# Patient Record
Sex: Male | Born: 1970 | Race: White | Hispanic: No | Marital: Married | Smoking: Never smoker
Health system: Southern US, Community
[De-identification: ages and names within clinical notes are randomized; demographics above are authoritative.]

---

## 2008-04-05 ENCOUNTER — Emergency Department (HOSPITAL_COMMUNITY): Admission: EM | Admit: 2008-04-05 | Discharge: 2008-04-05 | Payer: Self-pay | Admitting: Emergency Medicine

## 2014-02-04 ENCOUNTER — Encounter (HOSPITAL_COMMUNITY): Payer: Self-pay | Admitting: Emergency Medicine

## 2014-02-04 ENCOUNTER — Emergency Department (HOSPITAL_COMMUNITY): Payer: BC Managed Care – PPO

## 2014-02-04 ENCOUNTER — Emergency Department (HOSPITAL_COMMUNITY)
Admission: EM | Admit: 2014-02-04 | Discharge: 2014-02-04 | Disposition: A | Discharge status: Home / Self Care | Payer: BC Managed Care – PPO | Attending: Emergency Medicine | Admitting: Emergency Medicine

## 2014-02-04 DIAGNOSIS — S0010XA Contusion of unspecified eyelid and periocular area, initial encounter: Secondary | ICD-10-CM | POA: Insufficient documentation

## 2014-02-04 DIAGNOSIS — Y9364 Activity, baseball: Secondary | ICD-10-CM | POA: Insufficient documentation

## 2014-02-04 DIAGNOSIS — IMO0002 Reserved for concepts with insufficient information to code with codable children: Secondary | ICD-10-CM | POA: Insufficient documentation

## 2014-02-04 DIAGNOSIS — Y9229 Other specified public building as the place of occurrence of the external cause: Secondary | ICD-10-CM | POA: Insufficient documentation

## 2014-02-04 DIAGNOSIS — W219XXA Striking against or struck by unspecified sports equipment, initial encounter: Secondary | ICD-10-CM | POA: Insufficient documentation

## 2014-02-04 DIAGNOSIS — S01111A Laceration without foreign body of right eyelid and periocular area, initial encounter: Secondary | ICD-10-CM

## 2014-02-04 DIAGNOSIS — S0591XA Unspecified injury of right eye and orbit, initial encounter: Secondary | ICD-10-CM

## 2014-02-04 DIAGNOSIS — S058X9A Other injuries of unspecified eye and orbit, initial encounter: Secondary | ICD-10-CM | POA: Insufficient documentation

## 2014-02-04 DIAGNOSIS — S0081XA Abrasion of other part of head, initial encounter: Secondary | ICD-10-CM

## 2014-02-04 DIAGNOSIS — S0511XA Contusion of eyeball and orbital tissues, right eye, initial encounter: Secondary | ICD-10-CM

## 2014-02-04 MED ORDER — HYDROCODONE-ACETAMINOPHEN 5-325 MG PO TABS: 1.0000 | ORAL_TABLET | ORAL | Status: AC | PRN

## 2014-02-04 MED ORDER — TETRACAINE HCL 0.5 % OP SOLN
1.0000 [drp] | Freq: Once | OPHTHALMIC | Status: AC
Start: 2014-02-04 — End: 2014-02-04
  Administered 2014-02-04: 1 [drp] via OPHTHALMIC
  Filled 2014-02-04: qty 2

## 2014-02-04 MED ORDER — IBUPROFEN 800 MG PO TABS
800.0000 mg | ORAL_TABLET | Freq: Once | ORAL | Status: AC
  Administered 2014-02-04: 800 mg via ORAL
  Filled 2014-02-04: qty 1

## 2014-02-04 MED ORDER — FLUORESCEIN SODIUM 1 MG OP STRP
1.0000 | ORAL_STRIP | Freq: Once | OPHTHALMIC | Status: AC
  Administered 2014-02-04: 1 via OPHTHALMIC
  Filled 2014-02-04: qty 1

## 2014-02-04 NOTE — ED Provider Notes (Signed)
CSN: [id number]     Arrival date & time 02/04/14  1939 History  This chart was scribed for non-physician provider Antony Madura, PA-C, working with Hurman Horn, MD by Phillis Haggis, ED Scribe. This patient was seen in room WTR5/WTR5 and patient care was started at 8:55 PM.    Chief Complaint  Patient presents with  . Facial Laceration    Right eye denies vision changes   Patient is a 43 y.o. male presenting with skin laceration. The history is provided by the patient. No language interpreter was used.  Laceration Location:  Face Facial laceration location:  Face and R eye Bleeding: controlled   Laceration mechanism:  Blunt object Ineffective treatments:  None tried  HPI Comments: William Sosa is a 43 y.o. male who presents to the Emergency Department complaining of a facial laceration onset 2.5 hours ago. He states that he went to softball practice after work at [employer] where he was wearing sunglasses. He had the ball thrown at him without his knowledge to which he was hit in the face. He states that the ball hit his sunglasses and did not break the lens, but they did get knocked off his face. He states that his visual range is limited with photophobia. He denies LOC or blurred vision.   History reviewed. No pertinent past medical history. History reviewed. No pertinent past surgical history. No family history on file. History  Substance Use Topics  . Smoking status: Never Smoker   . Smokeless tobacco: Current User    Types: Snuff, Chew  . Alcohol Use: Yes     Comment: occ    Review of Systems  Eyes: Positive for photophobia, pain and visual disturbance.  Skin: Positive for wound.  Neurological: Negative for syncope.  All other systems reviewed and are negative.   Allergies  Bee venom and Poison ivy extract  Home Medications   Prior to Admission medications   Medication Sig Start Date End Date Taking? Authorizing Provider  HYDROcodone-acetaminophen (NORCO/VICODIN) 5-325 MG  per tablet Take 1 tablet by mouth every 4 (four) hours as needed for moderate pain or severe pain. 02/04/14   Antony Madura, PA-C   BP 160/96  Pulse 88  Temp(Src) 98 F (36.7 C) (Oral)  Resp 18  Ht 5\' 9"  (1.753 m)  Wt 185 lb (83.915 kg)  BMI 27.31 kg/m2  SpO2 98%  Physical Exam  Nursing note and vitals reviewed. Constitutional: He is oriented to person, place, and time. He appears well-developed and well-nourished. No distress.  Nontoxic/nonseptic appearing  HENT:  Head: Normocephalic and atraumatic.  Mouth/Throat: Oropharynx is clear and moist. No oropharyngeal exudate.  Eyes: EOM are normal. Pupils are equal, round, and reactive to light. Right conjunctiva is injected (mild). Right conjunctiva has no hemorrhage. No scleral icterus.  Fundoscopic exam:      The right eye shows red reflex.       The left eye shows red reflex.  Slit lamp exam:      The right eye shows no corneal abrasion, no corneal ulcer, no foreign body, no fluorescein uptake and no anterior chamber bulge.       The left eye shows no corneal abrasion, no corneal ulcer and no foreign body.    Periorbital contusion of R eye with 1.5cm superficial laceration medial to R eyebrow and 1cm laceration to upper eyelid. Anisocoria, R>L. Pupils equal round and reactive to direct and consensual light, though reactivity of R pupil does appear sluggish. Snellen 20/30 OS, OD  and 20/25 OU. No proptosis or hyphema. No pain with EOMs. No nystagmus. No uptake on fluorescein staining. No evidence of corneal abrasion or ulcer.  Neck: Normal range of motion.  Pulmonary/Chest: Effort normal. No respiratory distress.  Musculoskeletal: Normal range of motion.  Neurological: He is alert and oriented to person, place, and time.  GCS 15. Patient moves extremities without ataxia.  Skin: Skin is warm and dry. No rash noted. He is not diaphoretic. No erythema. No pallor.  Psychiatric: He has a normal mood and affect. His behavior is normal.    ED  Course  Procedures (including critical care time)] DIAGNOSTIC STUDIES: Oxygen Saturation is 98% on room air, normal by my interpretation.    COORDINATION OF CARE: 8:59 PM-Discussed treatment plan which includes  with pt at bedside and pt agreed to plan.   Labs Review Labs Reviewed - No data to display  Imaging Review Ct Orbitss W/o Cm  02/04/2014   CLINICAL DATA:  Patient was struck in the right eye with a softball. Small lacerations.  EXAM: CT ORBITS WITHOUT CONTRAST  TECHNIQUE: Multidetector CT imaging of the orbits was performed following the standard protocol without intravenous contrast.  COMPARISON:  None.  FINDINGS: Right periorbital soft tissue swelling/ hematoma with subcutaneous gas. Small amount of gas in the medial right extraconal soft tissues. No retrobulbar extension. The globes and extraocular muscles appear intact and symmetrical. Comminuted depressed fractures of the medial orbital wall with associated opacification of the right ethmoid air cells. Suggestion of minimal non depressed fracture of the inferior orbital wall. Mild mucosal thickening in the right maxillary antrum. Maxillary antral walls appear otherwise intact. Nasal bones, nasal septum, the left orbit of facial bones, zygomatic arches, and temporomandibular joints appear intact.  IMPRESSION: Right periorbital soft tissue hematoma with soft tissue emphysema extending in the periorbital and medial extraconal spaces. Comminuted displaced medial orbital wall fractures on the right with nondisplaced inferior orbital wall fracture. Mucosal thickening in the maxillary antrum.   Electronically Signed   By: Burman NievesWilliam  Stevens M.D.   On: 02/04/2014 22:05     EKG Interpretation None      LACERATION REPAIR Performed by: Antony MaduraHUMES, Adanna Zuckerman Authorized by: Antony MaduraHUMES, Gwenna Fuston Consent: Verbal consent obtained. Risks and benefits: risks, benefits and alternatives were discussed Consent given by: patient Patient identity confirmed: provided  demographic data Prepped and Draped in normal sterile fashion Wound explored  Laceration Location: R upper eyelid  Laceration Length: 1.5cm  No Foreign Bodies seen or palpated  Anesthesia: local infiltration  Local anesthetic: lidocaine 2% without epinephrine  Anesthetic total: <1 ml  Irrigation method: syringe Amount of cleaning: standard  Skin closure: 5-0 prolene  Number of sutures: 3  Technique: simple interrupted  Patient tolerance: Patient tolerated the procedure well with no immediate complications.  MDM   Final diagnoses:  Eye injury, right, initial encounter  Periorbital contusion of right eye, initial encounter  Laceration, eyelid, right, initial encounter  Abrasion of face, initial encounter    43 year old male presents to the emergency department after he was struck in the eye with a softball. No proptosis or hyphema. No pain with EOMs. No nystagmus. No evidence of corneal abrasion or ulcer; no uptake on fluorescein staining. Pupils are equal round and reactive to direct and consensual light; however, reactivity of right eye appear sluggish. Patient also noted to have anisocoria with right pupil more dilated than the left at baseline. Visual acuity intact.  CT ordered for further evaluation of symptoms which shows periorbital hematoma  as well as comminuted displaced medial orbital wall fractures of the right eye with a nondisplaced inferior orbital wall fracture. No evidence of entrapment on imaging or exam. Do not believe emergent ophthalmology f/u is indicated and case discussed with my attending. Do believe patient should f/u with ophthalmologist for further evaluation of symptoms within 24 hours, however. Email to the effect sent via Epic to Dr. Gwen PoundsKowalski. Patient will be discharged with home care instructions and return precautions. Patient agreeable to plan with no unaddressed concerns.  I personally performed the services described in this documentation,  which was scribed in my presence. The recorded information has been reviewed and is accurate.   Filed Vitals:   02/04/14 1942 02/04/14 2311  BP: 160/96 138/87  Pulse: 88 72  Temp: 98 F (36.7 C)   TempSrc: Oral   Resp: 18 16  Height: 5\' 9"  (1.753 m)   Weight: 185 lb (83.915 kg)   SpO2: 98% 100%   Antony MaduraKelly Ganon Demasi, PA-C 02/04/14 2340  2345 - Upon chart review, patient was not questioned on his tetanus status. Have asked that Patty, flow manager, call patient in AM to inquire into this. Patty notified to have patient return for Tdap if his tetanus has not been updated in the last 10 years.  Antony MaduraKelly Kaiyu Mirabal, PA-C 02/04/14 [phone]

## 2014-02-04 NOTE — ED Notes (Signed)
Patient reports being struck in the eye with a softball. Patient states he was wearing sunglasses, the side arm of the glasses broke, lens remained intact. Patient has two small lacerations to the right eye.

## 2014-02-04 NOTE — Discharge Instructions (Signed)
Contusion A contusion is a deep bruise. Contusions happen when an injury causes bleeding under the skin. Signs of bruising include pain, puffiness (swelling), and discolored skin. The contusion may turn blue, purple, or yellow. HOME CARE   Put ice on the injured area.  Put ice in a plastic bag.  Place a towel between your skin and the bag.  Leave the ice on for 15-20 minutes, 03-04 times a day.  Only take medicine as told by your doctor.  Rest the injured area.  If possible, raise (elevate) the injured area to lessen puffiness. GET HELP RIGHT AWAY IF:   You have more bruising or puffiness.  You have pain that is getting worse.  Your puffiness or pain is not helped by medicine. MAKE SURE YOU:   Understand these instructions.  Will watch your condition.  Will get help right away if you are not doing well or get worse. Document Released: 01/05/2008 Document Revised: 10/11/2011 Document Reviewed: 05/24/2011 [facility] Patient Information 2015 [place], [place]. This information is not intended to replace advice given to you by your health care provider. Make sure you discuss any questions you have with your health care provider. Laceration Care, Adult A laceration is a cut or lesion that goes through all layers of the skin and into the tissue just beneath the skin. TREATMENT  Some lacerations may not require closure. Some lacerations may not be able to be closed due to an increased risk of infection. It is important to see your caregiver as soon as possible after an injury to minimize the risk of infection and maximize the opportunity for successful closure. If closure is appropriate, pain medicines may be given, if needed. The wound will be cleaned to help prevent infection. Your caregiver will use stitches (sutures), staples, wound glue (adhesive), or skin adhesive strips to repair the laceration. These tools bring the skin edges together to allow for faster healing and a better  cosmetic outcome. However, all wounds will heal with a scar. Once the wound has healed, scarring can be minimized by covering the wound with sunscreen during the day for 1 full year. HOME CARE INSTRUCTIONS  For sutures or staples:  Keep the wound clean and dry.  If you were given a bandage (dressing), you should change it at least once a day. Also, change the dressing if it becomes wet or dirty, or as directed by your caregiver.  Wash the wound with soap and water 2 times a day. Rinse the wound off with water to remove all soap. Pat the wound dry with a clean towel.  After cleaning, apply a thin layer of the antibiotic ointment as recommended by your caregiver. This will help prevent infection and keep the dressing from sticking.  You may shower as usual after the first 24 hours. Do not soak the wound in water until the sutures are removed.  Only take over-the-counter or prescription medicines for pain, discomfort, or fever as directed by your caregiver.  Get your sutures or staples removed as directed by your caregiver. For skin adhesive strips:  Keep the wound clean and dry.  Do not get the skin adhesive strips wet. You may bathe carefully, using caution to keep the wound dry.  If the wound gets wet, pat it dry with a clean towel.  Skin adhesive strips will fall off on their own. You may trim the strips as the wound heals. Do not remove skin adhesive strips that are still stuck to the wound. They will  fall off in time. For wound adhesive:  You may briefly wet your wound in the shower or bath. Do not soak or scrub the wound. Do not swim. Avoid periods of heavy perspiration until the skin adhesive has fallen off on its own. After showering or bathing, gently pat the wound dry with a clean towel.  Do not apply liquid medicine, cream medicine, or ointment medicine to your wound while the skin adhesive is in place. This may loosen the film before your wound is healed.  If a dressing is  placed over the wound, be careful not to apply tape directly over the skin adhesive. This may cause the adhesive to be pulled off before the wound is healed.  Avoid prolonged exposure to sunlight or tanning lamps while the skin adhesive is in place. Exposure to ultraviolet light in the first year will darken the scar.  The skin adhesive will usually remain in place for 5 to 10 days, then naturally fall off the skin. Do not pick at the adhesive film. You may need a tetanus shot if:  You cannot remember when you had your last tetanus shot.  You have never had a tetanus shot. If you get a tetanus shot, your arm may swell, get red, and feel warm to the touch. This is common and not a problem. If you need a tetanus shot and you choose not to have one, there is a rare chance of getting tetanus. Sickness from tetanus can be serious. SEEK MEDICAL CARE IF:   You have redness, swelling, or increasing pain in the wound.  You see a red line that goes away from the wound.  You have yellowish-white fluid (pus) coming from the wound.  You have a fever.  You notice a bad smell coming from the wound or dressing.  Your wound breaks open before or after sutures have been removed.  You notice something coming out of the wound such as wood or glass.  Your wound is on your hand or foot and you cannot move a finger or toe. SEEK IMMEDIATE MEDICAL CARE IF:   Your pain is not controlled with prescribed medicine.  You have severe swelling around the wound causing pain and numbness or a change in color in your arm, hand, leg, or foot.  Your wound splits open and starts bleeding.  You have worsening numbness, weakness, or loss of function of any joint around or beyond the wound.  You develop painful lumps near the wound or on the skin anywhere on your body. MAKE SURE YOU:   Understand these instructions.  Will watch your condition.  Will get help right away if you are not doing well or get  worse. Document Released: 07/19/2005 Document Revised: 10/11/2011 Document Reviewed: 01/12/2011 [facility] Patient Information 2015 [place], [place]. This information is not intended to replace advice given to you by your health care provider. Make sure you discuss any questions you have with your health care provider.

## 2014-02-05 ENCOUNTER — Telehealth (HOSPITAL_BASED_OUTPATIENT_CLINIC_OR_DEPARTMENT_OTHER): Payer: Self-pay

## 2014-02-05 NOTE — Telephone Encounter (Signed)
Call from K. Humes PA to please call pt to see if he has had a tetanus shot in the last 10 years if not please f/u w/PCP or [facility]

## 2014-02-05 NOTE — ED Provider Notes (Signed)
Medical screening examination/treatment/procedure(s) were conducted as a shared visit with non-physician practitioner(s) and myself.  I personally evaluated the patient during the encounter.   EKG Interpretation None     Denies change in vision states just more sensitive to light; right pupil slight anisocoria but reactive without gross hyphema and no FB sensation, EOMI; doubt globe rupture  Hurman HornJohn M Jourdain Guay, MD 02/05/14 1348

## 2014-02-11 ENCOUNTER — Emergency Department (HOSPITAL_COMMUNITY)
Admission: EM | Admit: 2014-02-11 | Discharge: 2014-02-11 | Disposition: A | Discharge status: Home / Self Care | Payer: BC Managed Care – PPO | Attending: Emergency Medicine | Admitting: Emergency Medicine

## 2014-02-11 DIAGNOSIS — Z4802 Encounter for removal of sutures: Secondary | ICD-10-CM | POA: Insufficient documentation

## 2014-02-11 DIAGNOSIS — H53149 Visual discomfort, unspecified: Secondary | ICD-10-CM | POA: Insufficient documentation

## 2014-02-11 NOTE — Discharge Instructions (Signed)

## 2014-02-11 NOTE — ED Notes (Signed)
Pt reports for removal of sutures above right eye.

## 2014-02-11 NOTE — ED Provider Notes (Signed)
Medical screening examination/treatment/procedure(s) were performed by non-physician practitioner and as supervising physician I was immediately available for consultation/collaboration.   EKG Interpretation None        Christopher J. Pollina, MD 02/11/14 2119 

## 2014-02-11 NOTE — ED Provider Notes (Signed)
CSN: [id number]     Arrival date & time 02/11/14  1741 History  This chart was scribed for Santiago Glad, PA-C working with Gilda Crease, * by Evon Slack, ED Scribe. This patient was seen in room WTR7/WTR7 and the patient's care was started at 6:36 PM.  Chief Complaint  Patient presents with  . Suture / Staple Removal    The history is provided by the patient. No language interpreter was used.   HPI Comments: William Sosa is a 43 y.o. male who presents to the Emergency Department requesting suture removal from the skin just above right eye. Sutures were placed in the ED seven days ago.  He states that he has been having some photophobia. He states he has followed up with his regular eye doctor since having the sutures placed. He denies drainage, fever, chills, double vision, or related symptoms.  No past medical history on file. No past surgical history on file. No family history on file. History  Substance Use Topics  . Smoking status: Never Smoker   . Smokeless tobacco: Current User    Types: Snuff, Chew  . Alcohol Use: Yes     Comment: occ    Review of Systems  Constitutional: Negative for fever and chills.  Eyes: Positive for photophobia. Negative for pain and discharge.  Skin: Positive for wound.    Allergies  Bee venom and Poison ivy extract  Home Medications   Prior to Admission medications   Medication Sig Start Date End Date Taking? Authorizing Provider  HYDROcodone-acetaminophen (NORCO/VICODIN) 5-325 MG per tablet Take 1 tablet by mouth every 4 (four) hours as needed for moderate pain or severe pain. 02/04/14   Antony Madura, PA-C   BP 130/76  Pulse 54  Temp(Src) 98.5 F (36.9 C) (Oral)  Resp 16  SpO2 96%  Physical Exam  Nursing note and vitals reviewed. Constitutional: He is oriented to person, place, and time. He appears well-developed and well-nourished. No distress.  HENT:  Head: Normocephalic and atraumatic.    Eyes: Conjunctivae and  EOM are normal.  Neck: Neck supple. No tracheal deviation present.  Cardiovascular: Normal rate, regular rhythm and normal heart sounds.   Pulmonary/Chest: Effort normal and breath sounds normal. No respiratory distress.  Musculoskeletal: Normal range of motion.  Neurological: He is alert and oriented to person, place, and time.  Skin: Skin is warm and dry.  Psychiatric: He has a normal mood and affect. His behavior is normal.    ED Course  SUTURE REMOVAL Date/Time: 02/11/2014 6:50 PM Performed by: Santiago Glad Authorized by: Santiago Glad Consent: Verbal consent obtained. written consent not obtained. Risks and benefits: risks, benefits and alternatives were discussed Consent given by: patient Patient understanding: patient states understanding of the procedure being performed Patient consent: the patient's understanding of the procedure matches consent given Procedure consent: procedure consent matches procedure scheduled Patient identity confirmed: verbally with patient Time out: Immediately prior to procedure a "time out" was called to verify the correct patient, procedure, equipment, support staff and site/side marked as required. Location: right face just below right eyebrow. Wound Appearance: clean Sutures Removed: 3 Facility: sutures placed in this facility Patient tolerance: Patient tolerated the procedure well with no immediate complications.   (including critical care time)  Labs Review Labs Reviewed - No data to display  Imaging Review No results found.   EKG Interpretation None      MDM   Final diagnoses:  None   Patient presenting today for suture removal.  No signs of infection today.  Sutures removed without difficulty.  Skin well approximated.  Patient stable for discharge.     Santiago GladHeather Deni Lefever, PA-C 02/11/14 1851

## 2015-12-11 IMAGING — CT CT ORBITS W/O CM
1 series · 15 of 30 positions shown, 19 images · non-contrast
Comparison: None.

CLINICAL DATA: Patient was struck in the right eye with a softball.
Small lacerations.

EXAM:
CT ORBITS WITHOUT CONTRAST
TECHNIQUE: Multidetector CT imaging of the orbits was performed following the
standard protocol without intravenous contrast.

[Series 3: orbits st · axial · 0.36mm/px · z∈[+1408,+1498]mm · 15 of 49 slices shown, 19 images]
[im 2/49  brain]
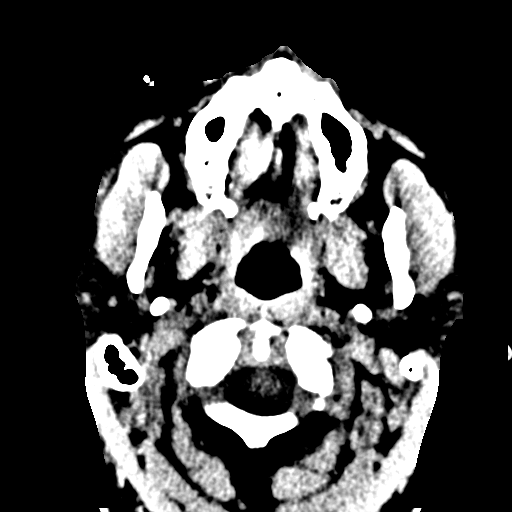
[im 2/49  bone]
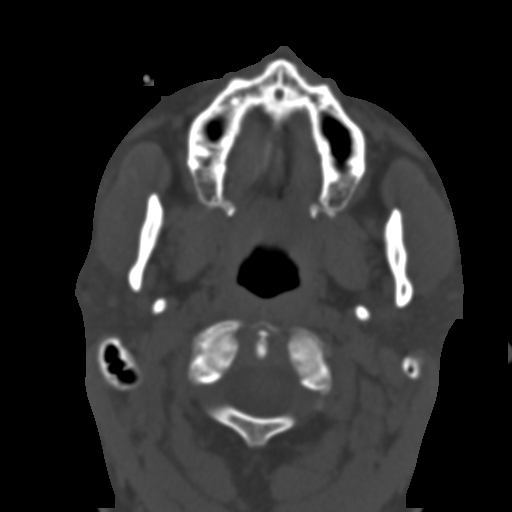
[im 5/49  bone]
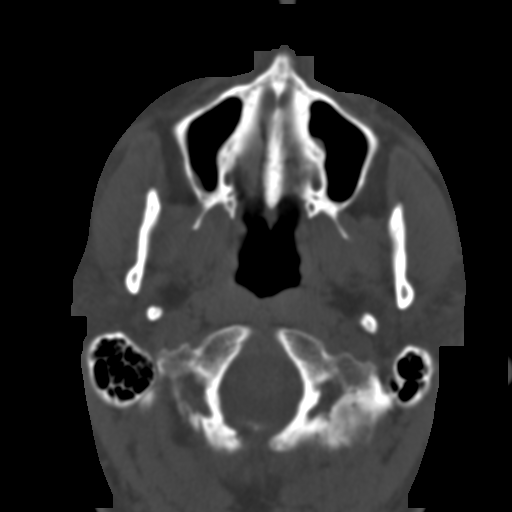
[im 9/49  bone]
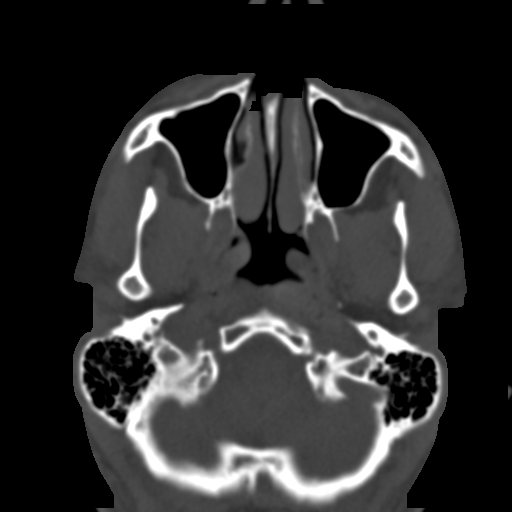
[im 12/49  bone]
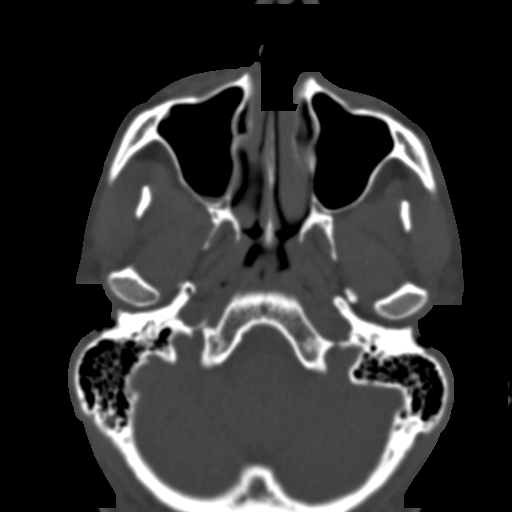
[im 15/49  brain]
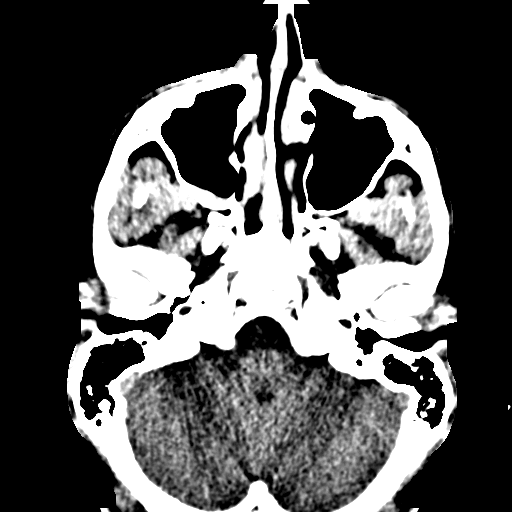
[im 15/49  bone]
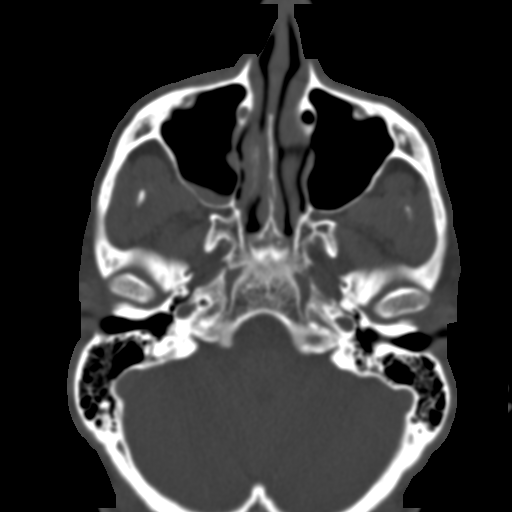
[im 19/49  bone]
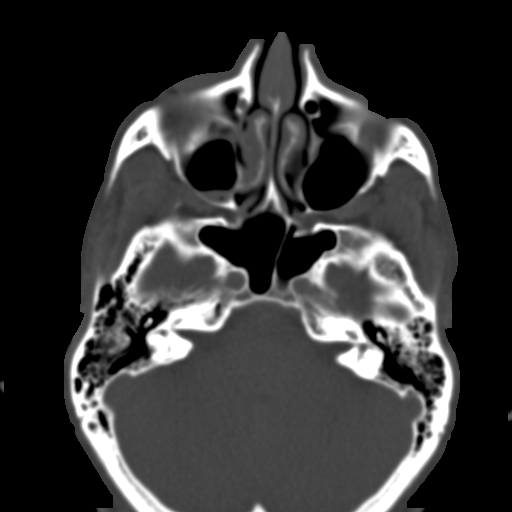
[im 22/49  bone]
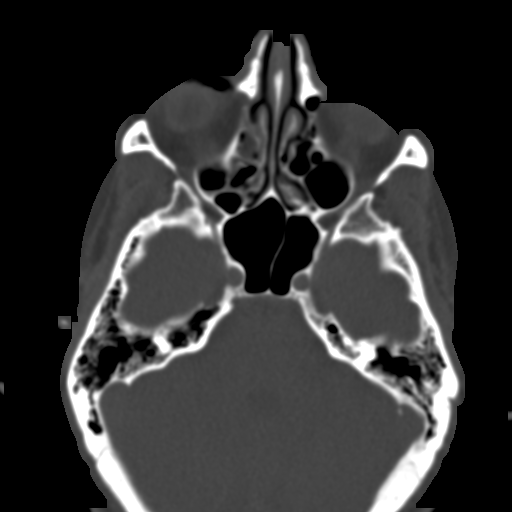
[im 25/49  bone]
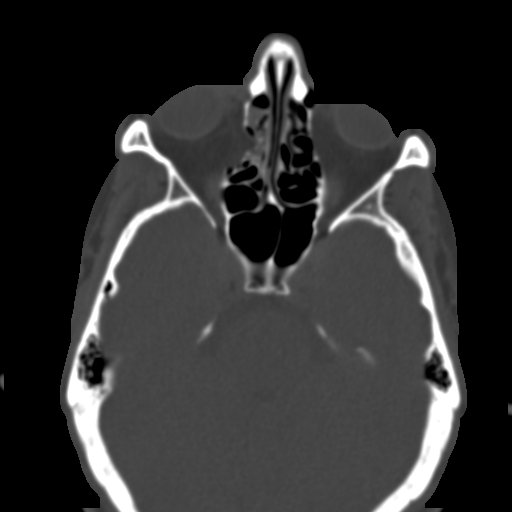
[im 27/49  brain]
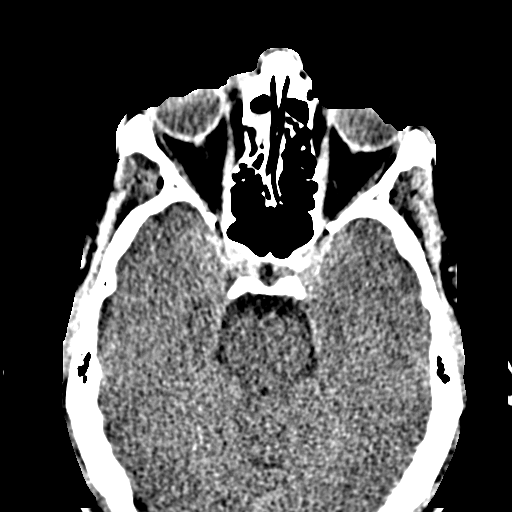
[im 27/49  bone]
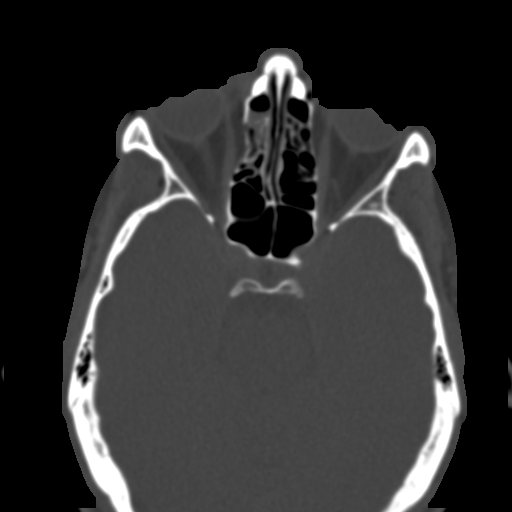
[im 30/49  bone]
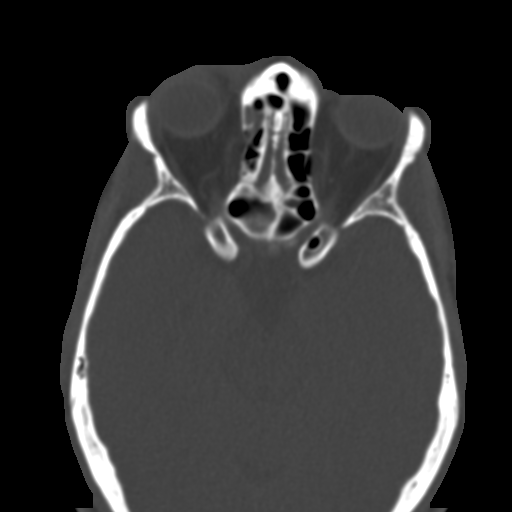
[im 34/49  bone]
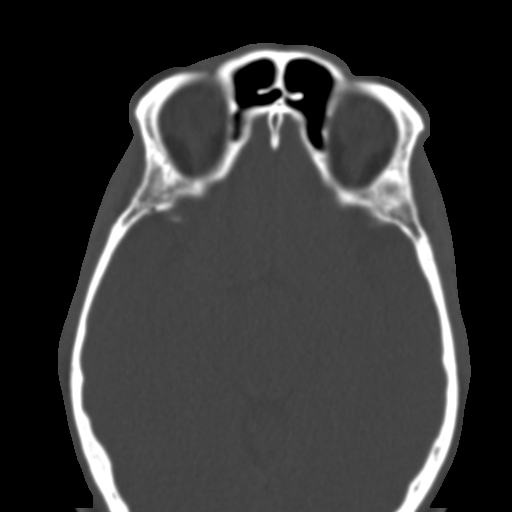
[im 37/49  bone]
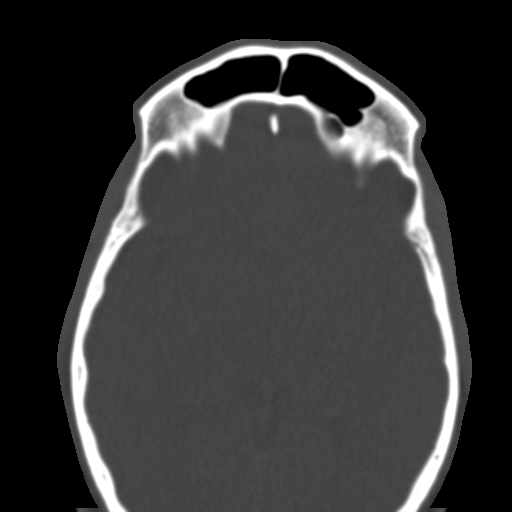
[im 40/49  brain]
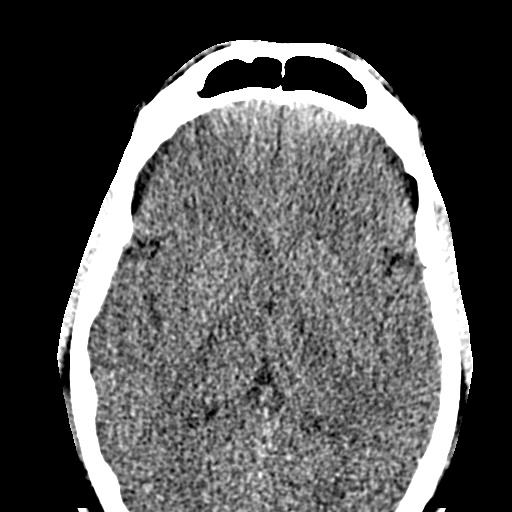
[im 40/49  bone]
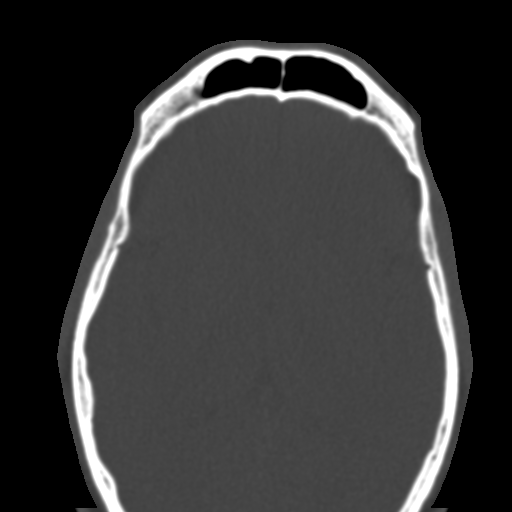
[im 44/49  bone]
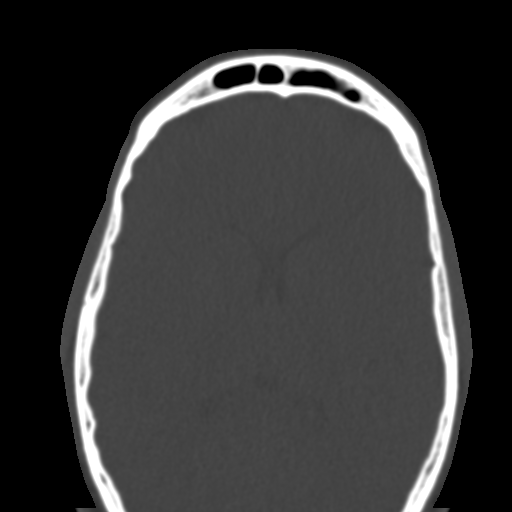
[im 47/49  bone]
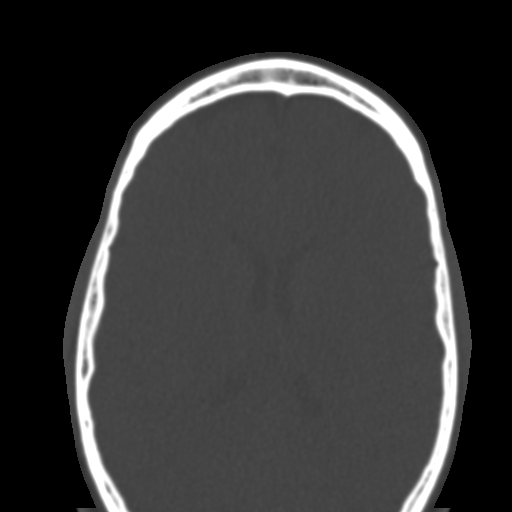

[15 of 30 positions shown; findings below may reference images not displayed]

FINDINGS: Right periorbital soft tissue swelling/ hematoma with subcutaneous
gas. Small amount of gas in the medial right extraconal soft
tissues. No retrobulbar extension. The globes and extraocular
muscles appear intact and symmetrical. Comminuted depressed
fractures of the medial orbital wall with associated opacification
of the right ethmoid air cells. Suggestion of minimal non depressed
fracture of the inferior orbital wall. Mild mucosal thickening in
the right maxillary antrum. Maxillary antral walls appear otherwise
intact. Nasal bones, nasal septum, the left orbit of facial bones,
zygomatic arches, and temporomandibular joints appear intact.
IMPRESSION: Right periorbital soft tissue hematoma with soft tissue emphysema
extending in the periorbital and medial extraconal spaces.
Comminuted displaced medial orbital wall fractures on the right with
nondisplaced inferior orbital wall fracture. Mucosal thickening in
the maxillary antrum.
